# Patient Record
Sex: Male | Born: 1955 | Race: Black or African American | Hispanic: No | Smoking: Never smoker
Health system: Southern US, Community
[De-identification: ages and names within clinical notes are randomized; demographics above are authoritative.]

---

## 2004-08-10 ENCOUNTER — Emergency Department (HOSPITAL_COMMUNITY): Admission: EM | Admit: 2004-08-10 | Discharge: 2004-08-11 | Payer: Self-pay | Admitting: Emergency Medicine

## 2004-08-12 ENCOUNTER — Emergency Department (HOSPITAL_COMMUNITY): Admission: EM | Admit: 2004-08-12 | Discharge: 2004-08-12 | Payer: Self-pay | Admitting: Emergency Medicine

## 2006-12-11 IMAGING — CR DG FINGER RING 2+V*R*
3 series · 3 of 3 positions shown · non-contrast
Comparison: none

CLINICAL DATA: Injury to distal right ring finger playing baseball last week. 
 RIGHT RING FINGER ? [] VIEWS:
 Recent fracture of the distal aspect of the terminal phalangeal tuft.

[x finger pa right]
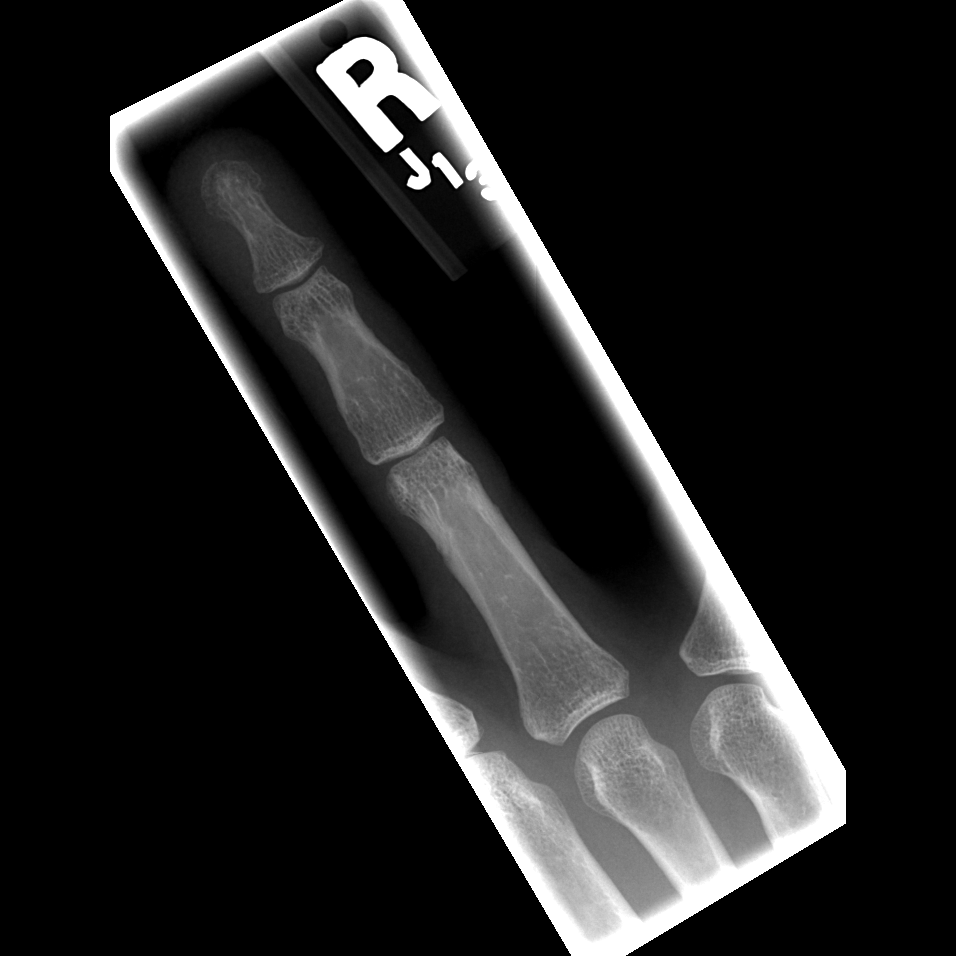

[x finger obl. right]
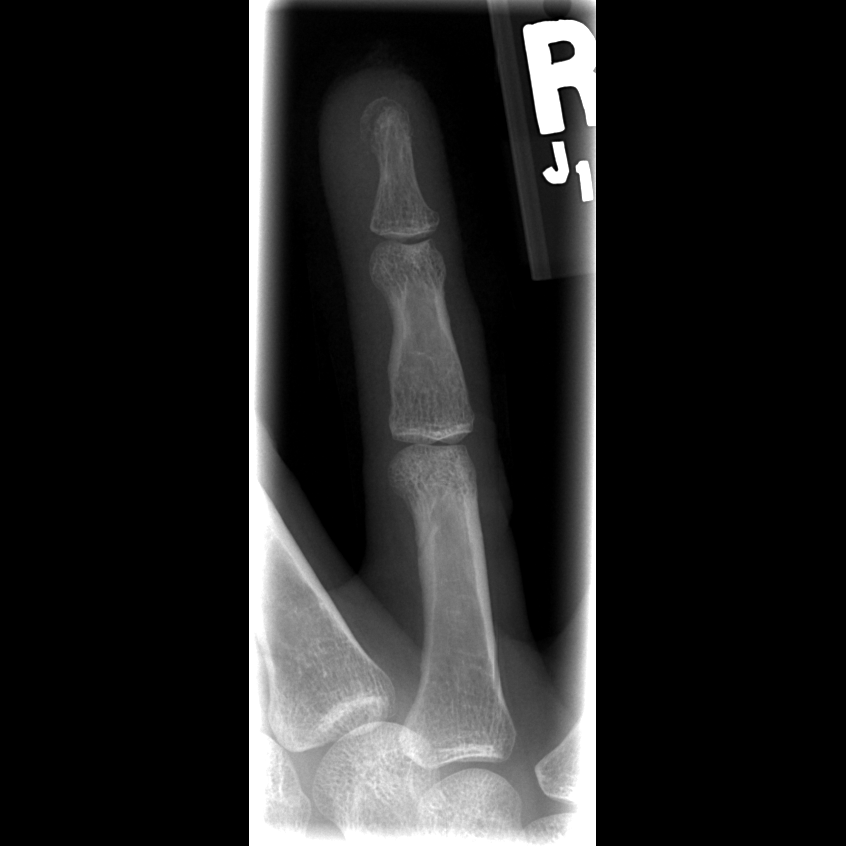

[x finger lateral right]
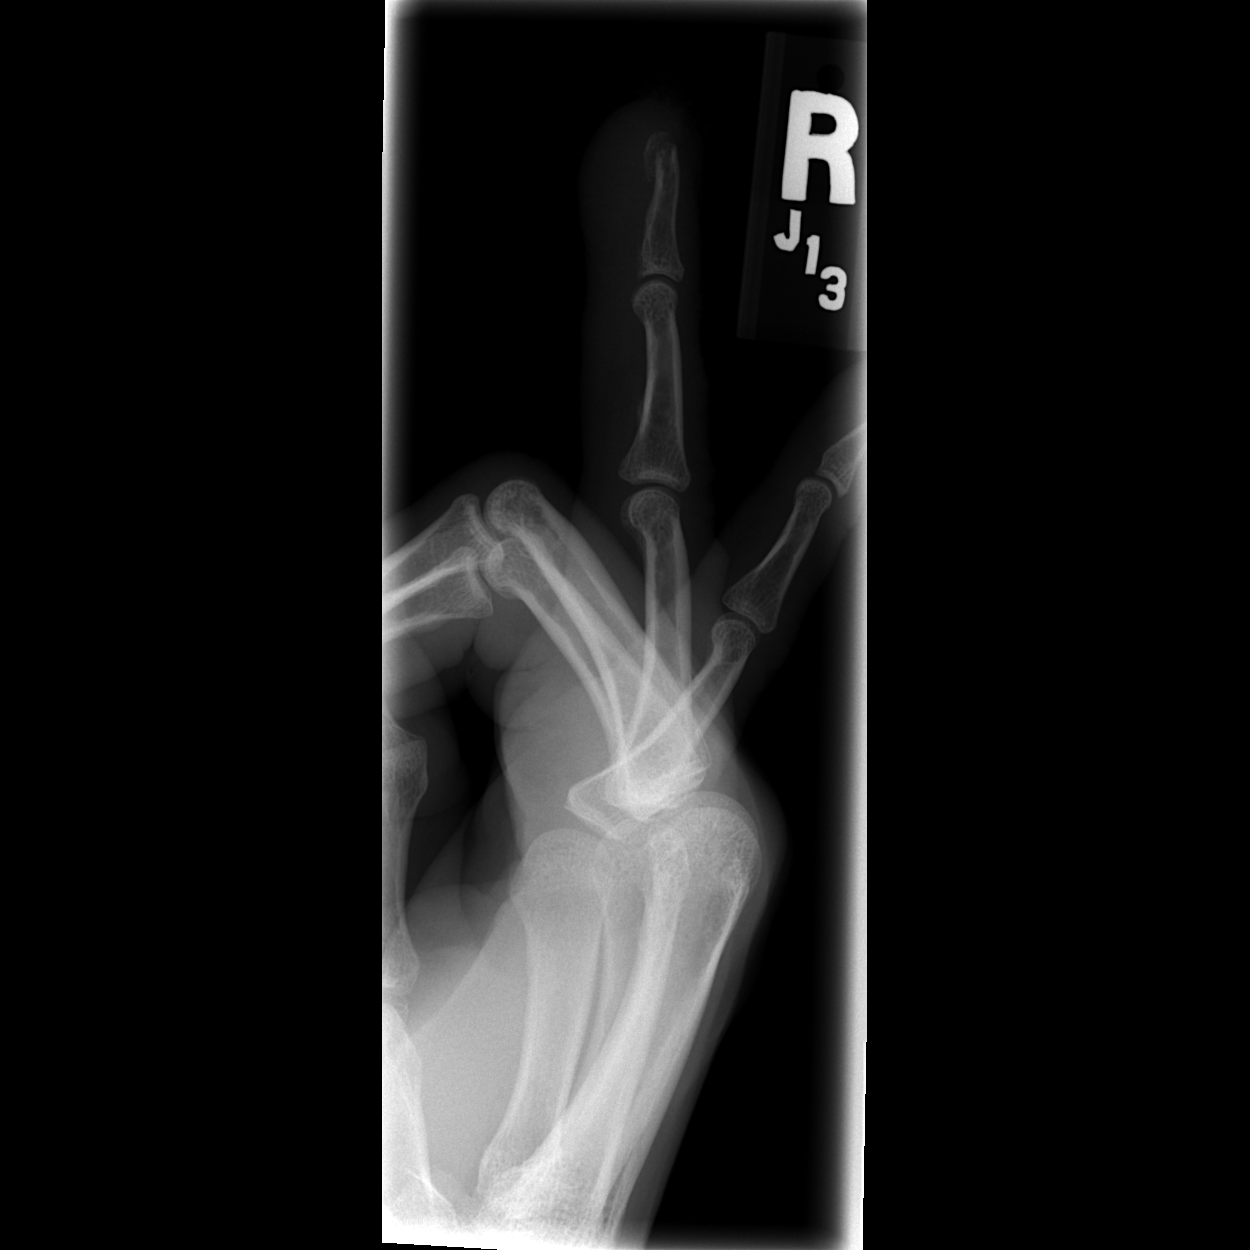

[3 of 3 positions shown; findings below may reference images not displayed]

IMPRESSION: Fracture terminal phalangeal tuft right fourth finger.

## 2015-03-10 ENCOUNTER — Ambulatory Visit (INDEPENDENT_AMBULATORY_CARE_PROVIDER_SITE_OTHER): Payer: Self-pay | Admitting: Emergency Medicine

## 2015-03-10 ENCOUNTER — Ambulatory Visit (INDEPENDENT_AMBULATORY_CARE_PROVIDER_SITE_OTHER): Payer: Self-pay

## 2015-03-10 VITALS — BP 124/80 | HR 80 | Temp 98.6°F | Resp 17 | Ht 68.5 in | Wt 189.0 lb

## 2015-03-10 DIAGNOSIS — J3089 Other allergic rhinitis: Secondary | ICD-10-CM

## 2015-03-10 DIAGNOSIS — R05 Cough: Secondary | ICD-10-CM

## 2015-03-10 DIAGNOSIS — R938 Abnormal findings on diagnostic imaging of other specified body structures: Secondary | ICD-10-CM

## 2015-03-10 DIAGNOSIS — R059 Cough, unspecified: Secondary | ICD-10-CM

## 2015-03-10 DIAGNOSIS — R9389 Abnormal findings on diagnostic imaging of other specified body structures: Secondary | ICD-10-CM

## 2015-03-10 DIAGNOSIS — R942 Abnormal results of pulmonary function studies: Secondary | ICD-10-CM

## 2015-03-10 LAB — POCT CBC
Granulocyte percent: 44.8 %G (ref 37–80)
HEMATOCRIT: 47.9 % (ref 43.5–53.7)
Hemoglobin: 16.4 g/dL (ref 14.1–18.1)
LYMPH, POC: 2.4 (ref 0.6–3.4)
MCH, POC: 29.3 pg (ref 27–31.2)
MCHC: 34.3 g/dL (ref 31.8–35.4)
MCV: 85.4 fL (ref 80–97)
MID (CBC): 0.5 (ref 0–0.9)
MPV: 7.4 fL (ref 0–99.8)
POC GRANULOCYTE: 2.3 (ref 2–6.9)
POC LYMPH %: 45.2 % (ref 10–50)
POC MID %: 10 % (ref 0–12)
Platelet Count, POC: 228 10*3/uL (ref 142–424)
RBC: 5.61 M/uL (ref 4.69–6.13)
RDW, POC: 13.5 %
WBC: 5.2 10*3/uL (ref 4.6–10.2)

## 2015-03-10 LAB — PULMONARY FUNCTION TEST

## 2015-03-10 MED ORDER — ALBUTEROL SULFATE HFA 108 (90 BASE) MCG/ACT IN AERS: 2.0000 | INHALATION_SPRAY | RESPIRATORY_TRACT | Status: DC | PRN

## 2015-03-10 MED ORDER — DOXYCYCLINE HYCLATE 100 MG PO CAPS: 100.0000 mg | ORAL_CAPSULE | Freq: Two times a day (BID) | ORAL | Status: DC

## 2015-03-10 MED ORDER — FLUTICASONE PROPIONATE 50 MCG/ACT NA SUSP: 2.0000 | Freq: Every day | NASAL | Status: DC

## 2015-03-10 MED ORDER — IPRATROPIUM BROMIDE 0.02 % IN SOLN
0.5000 mg | Freq: Once | RESPIRATORY_TRACT | Status: AC
  Administered 2015-03-10: 0.5 mg via RESPIRATORY_TRACT

## 2015-03-10 MED ORDER — ALBUTEROL SULFATE (2.5 MG/3ML) 0.083% IN NEBU
2.5000 mg | INHALATION_SOLUTION | Freq: Once | RESPIRATORY_TRACT | Status: AC
Start: 2015-03-10 — End: 2015-03-10
  Administered 2015-03-10: 2.5 mg via RESPIRATORY_TRACT

## 2015-03-10 NOTE — Progress Notes (Addendum)
By signing my name below, I, Gregory Rocha, attest that this documentation has been prepared under the direction and in the presence of Gregory Chris, MD.  Gregory Rocha, Medical Scribe. 03/10/2015.  12:56 PM.   Chief Complaint:  Chief Complaint  Patient presents with  . URI  . Shortness of Breath    HPI: Gregory Rocha is a 60 y.o. male who reports to [facility] today complaining of a possible URI, gradual onset 1 month ago. Pt reports that he has been having persistent cough with white-yellow discharge, and congestion. He indicates that at times his couging episodes are severe enough to cause mild chest wall pain. Pt took sudafed few days ago, and notes that he found relief for his congestion. He reports no history of experiencing similar symptoms recently. Pt is not a smoker, and he reports that he is not on any medications currently. He denies chest pain, wheezing, rales, heat burn, belching, recent travels, or being exposed to new pets.    No past medical history on file. No past surgical history on file. Social History   Social History  . Marital Status: Married    Spouse Name: N/A  . Number of Children: N/A  . Years of Education: N/A   Social History Main Topics  . Smoking status: Never Smoker   . Smokeless tobacco: None  . Alcohol Use: None  . Drug Use: None  . Sexual Activity: Not Asked   Other Topics Concern  . None   Social History Narrative  . None   No family history on file. No Known Allergies Prior to Admission medications   Not on File     ROS: The patient has cough, congestion.  Patient denies chest pain, wheezing, heat burn, or belching,  All other systems have been reviewed and were otherwise negative with the exception of those mentioned in the HPI and as above.    PHYSICAL EXAM: Filed Vitals:   03/10/15 1213  BP: 124/80  Pulse: 80  Temp: 98.6 F (37 C)  Resp: 17   Body mass index is 28.32 kg/(m^2).   General: Alert, no acute  distress HEENT:  Normocephalic, atraumatic, oropharynx patent. Eye: Nonie Hoyer [facility] Cardiovascular:  Regular rate and rhythm, no rubs murmurs or gallops.  No Carotid bruits, radial pulse intact. No pedal edema.  Respiratory: No rales.  No cyanosis, no use of accessory musculature. Coarse rhonchi both lungs. No wheezes. Deep sounding wet cough.  Abdominal: No organomegaly, abdomen is soft and non-tender, positive bowel sounds.  No masses. Musculoskeletal: Gait intact. No edema, tenderness Skin: No rashes. Neurologic: Facial musculature symmetric. Psychiatric: Patient acts appropriately throughout our interaction. Lymphatic: No cervical or submandibular lymphadenopathy   LABS: Results for orders placed or performed in visit on 03/10/15  POCT CBC  Result Value Ref Range   WBC 5.2 4.6 - 10.2 K/uL   Lymph, poc 2.4 0.6 - 3.4   POC LYMPH PERCENT 45.2 10 - 50 %L   MID (cbc) 0.5 0 - 0.9   POC MID % 10.0 0 - 12 %M   POC Granulocyte 2.3 2 - 6.9   Granulocyte percent 44.8 37 - 80 %G   RBC 5.61 4.69 - 6.13 M/uL   Hemoglobin 16.4 14.1 - 18.1 g/dL   HCT, POC 45.4 09.8 - 53.7 %   MCV 85.4 80 - 97 fL   MCH, POC 29.3 27 - 31.2 pg   MCHC 34.3 31.8 - 35.4 g/dL   RDW, POC 11.9 %  Platelet Count, POC 228 142 - 424 K/uL   MPV 7.4 0 - 99.8 fL     EKG/XRAY:   Primary read interpreted by Dr. Cleta Alberts at [facility].  Dg Chest 2 View  03/10/2015  CLINICAL DATA:  Shortness of breath EXAM: CHEST  2 VIEW COMPARISON:  None in PACs FINDINGS: The lungs are mildly hyperinflated with hemidiaphragm flattening. The heart and pulmonary vascularity are normal. The mediastinum is normal in width. There is no pleural effusion there is no pneumothorax. The bony thorax is unremarkable. IMPRESSION: Hyperinflation consistent with reactive airway disease or COPD. There is no evidence of pneumonia nor CHF. Electronically Signed   By: David  [place] M.D.   On: 03/10/2015 13:17    ASSESSMENT/PLAN: Chest x-ray is consistent with COPD.  Patient is not a smoker. Will treat with doxycycline along with an albuterol inhaler and Flonase. Referral made to pulmonary for their evaluation. Pulmonary function test and our office revealed an FVC of 73% FEV1 of 77%.I personally performed the services described in this documentation, which was scribed in my presence. The recorded information has been reviewed and is accurate.    Gross sideeffects, risk and benefits, and alternatives of medications d/w patient. Patient is aware that all medications have potential sideeffects and we are unable to predict every sideeffect or drug-drug interaction that may occur.  Gregory Chris MD 03/10/2015 12:48 PM

## 2015-03-10 NOTE — Patient Instructions (Addendum)
Because you received an x-ray today, you will receive an invoice from [facility]. Please contact [facility] at [phone] with questions or concerns regarding your invoice. Our billing staff will not be able to assist you with those questions. Allergic Rhinitis Allergic rhinitis is when the mucous membranes in the nose respond to allergens. Allergens are particles in the air that cause your body to have an allergic reaction. This causes you to release allergic antibodies. Through a chain of events, these eventually cause you to release histamine into the blood stream. Although meant to protect the body, it is this release of histamine that causes your discomfort, such as frequent sneezing, congestion, and an itchy, runny nose.  CAUSES Seasonal allergic rhinitis (hay fever) is caused by pollen allergens that may come from grasses, trees, and weeds. Year-round allergic rhinitis (perennial allergic rhinitis) is caused by allergens such as house dust mites, pet dander, and mold spores. SYMPTOMS  Nasal stuffiness (congestion).  Itchy, runny nose with sneezing and tearing of the eyes. DIAGNOSIS Your health care provider can help you determine the allergen or allergens that trigger your symptoms. If you and your health care provider are unable to determine the allergen, skin or blood testing may be used. Your health care provider will diagnose your condition after taking your health history and performing a physical exam. Your health care provider may assess you for other related conditions, such as asthma, pink eye, or an ear infection. TREATMENT Allergic rhinitis does not have a cure, but it can be controlled by:  Medicines that block allergy symptoms. These may include allergy shots, nasal sprays, and oral antihistamines.  Avoiding the allergen. Hay fever may often be treated with antihistamines in pill or nasal spray forms. Antihistamines block the effects of histamine. There  are over-the-counter medicines that may help with nasal congestion and swelling around the eyes. Check with your health care provider before taking or giving this medicine. If avoiding the allergen or the medicine prescribed do not work, there are many new medicines your health care provider can prescribe. Stronger medicine may be used if initial measures are ineffective. Desensitizing injections can be used if medicine and avoidance does not work. Desensitization is when a patient is given ongoing shots until the body becomes less sensitive to the allergen. Make sure you follow up with your health care provider if problems continue. HOME CARE INSTRUCTIONS It is not possible to completely avoid allergens, but you can reduce your symptoms by taking steps to limit your exposure to them. It helps to know exactly what you are allergic to so that you can avoid your specific triggers. SEEK MEDICAL CARE IF:  You have a fever.  You develop a cough that does not stop easily (persistent).  You have shortness of breath.  You start wheezing.  Symptoms interfere with normal daily activities.   This information is not intended to replace advice given to you by your health care provider. Make sure you discuss any questions you have with your health care provider.   Document Released: 10/20/2000 Document Revised: 02/15/2014 Document Reviewed: 10/02/2012 Elsevier Interactive Patient Education 2016 Elsevier Inc. Cough, Adult Coughing is a reflex that clears your throat and your airways. Coughing helps to heal and protect your lungs. It is normal to cough occasionally, but a cough that happens with other symptoms or lasts a long time may be a sign of a condition that needs treatment. A cough may last only 2-3 weeks (acute), or it may last longer  than 8 weeks (chronic). CAUSES Coughing is commonly caused by:  Breathing in substances that irritate your lungs.  A viral or bacterial respiratory  infection.  Allergies.  Asthma.  Postnasal drip.  Smoking.  Acid backing up from the stomach into the esophagus (gastroesophageal reflux).  Certain medicines.  Chronic lung problems, including COPD (or rarely, lung cancer).  Other medical conditions such as heart failure. HOME CARE INSTRUCTIONS  Pay attention to any changes in your symptoms. Take these actions to help with your discomfort:  Take medicines only as told by your health care provider.  If you were prescribed an antibiotic medicine, take it as told by your health care provider. Do not stop taking the antibiotic even if you start to feel better.  Talk with your health care provider before you take a cough suppressant medicine.  Drink enough fluid to keep your urine clear or pale yellow.  If the air is dry, use a cold steam vaporizer or humidifier in your bedroom or your home to help loosen secretions.  Avoid anything that causes you to cough at work or at home.  If your cough is worse at night, try sleeping in a semi-upright position.  Avoid cigarette smoke. If you smoke, quit smoking. If you need help quitting, ask your health care provider.  Avoid caffeine.  Avoid alcohol.  Rest as needed. SEEK MEDICAL CARE IF:   You have new symptoms.  You cough up pus.  Your cough does not get better after 2-3 weeks, or your cough gets worse.  You cannot control your cough with suppressant medicines and you are losing sleep.  You develop pain that is getting worse or pain that is not controlled with pain medicines.  You have a fever.  You have unexplained weight loss.  You have night sweats. SEEK IMMEDIATE MEDICAL CARE IF:  You cough up blood.  You have difficulty breathing.  Your heartbeat is very fast.   This information is not intended to replace advice given to you by your health care provider. Make sure you discuss any questions you have with your health care provider.   Document Released:  07/24/2010 Document Revised: 10/16/2014 Document Reviewed: 04/03/2014 Elsevier Interactive Patient Education [employer]

## 2015-03-21 ENCOUNTER — Encounter: Payer: Self-pay | Admitting: Emergency Medicine

## 2015-03-26 ENCOUNTER — Other Ambulatory Visit: Payer: Self-pay | Admitting: Emergency Medicine

## 2015-03-27 ENCOUNTER — Other Ambulatory Visit: Payer: Self-pay | Admitting: Emergency Medicine

## 2015-04-01 ENCOUNTER — Encounter: Payer: Self-pay | Admitting: Internal Medicine

## 2015-04-01 ENCOUNTER — Other Ambulatory Visit (INDEPENDENT_AMBULATORY_CARE_PROVIDER_SITE_OTHER): Payer: Self-pay

## 2015-04-01 ENCOUNTER — Ambulatory Visit (INDEPENDENT_AMBULATORY_CARE_PROVIDER_SITE_OTHER): Payer: Self-pay | Admitting: Internal Medicine

## 2015-04-01 VITALS — BP 120/72 | HR 83 | Ht 69.0 in | Wt 185.8 lb

## 2015-04-01 DIAGNOSIS — R05 Cough: Secondary | ICD-10-CM

## 2015-04-01 DIAGNOSIS — R058 Other specified cough: Secondary | ICD-10-CM

## 2015-04-01 LAB — CBC WITH DIFFERENTIAL/PLATELET
BASOS PCT: 1.3 % (ref 0.0–3.0)
Basophils Absolute: 0.1 10*3/uL (ref 0.0–0.1)
EOS ABS: 0.1 10*3/uL (ref 0.0–0.7)
EOS PCT: 1.5 % (ref 0.0–5.0)
HEMATOCRIT: 44 % (ref 39.0–52.0)
HEMOGLOBIN: 15 g/dL (ref 13.0–17.0)
LYMPHS PCT: 43.7 % (ref 12.0–46.0)
Lymphs Abs: 2 10*3/uL (ref 0.7–4.0)
MCHC: 34.1 g/dL (ref 30.0–36.0)
MCV: 84.2 fl (ref 78.0–100.0)
MONOS PCT: 9.7 % (ref 3.0–12.0)
Monocytes Absolute: 0.4 10*3/uL (ref 0.1–1.0)
Neutro Abs: 2 10*3/uL (ref 1.4–7.7)
Neutrophils Relative %: 43.8 % (ref 43.0–77.0)
Platelets: 227 10*3/uL (ref 150.0–400.0)
RBC: 5.22 Mil/uL (ref 4.22–5.81)
RDW: 13.8 % (ref 11.5–15.5)
WBC: 4.5 10*3/uL (ref 4.0–10.5)

## 2015-04-01 NOTE — Patient Instructions (Signed)
Try prilosec otc   Take 30-60 min before first meal of the day and Pepcid ac (famotidine) 20 mg one @  bedtime until cough is completely gone for at least a week without the need for cough suppression   GERD (REFLUX)  is an extremely common cause of respiratory symptoms just like yours , many times with no obvious heartburn at all.    It can be treated with medication, but also with lifestyle changes including elevation of the head of your bed (ideally with 6 inch  bed blocks),  Smoking cessation, avoidance of late meals, excessive alcohol, and avoid fatty foods, chocolate, peppermint, colas, red wine, and acidic juices such as orange juice.  NO MINT OR MENTHOL PRODUCTS SO NO COUGH DROPS  USE SUGARLESS CANDY INSTEAD (Jolley ranchers or Stover's or Life Savers) or even ice chips will also do - the key is to swallow to prevent all throat clearing. NO OIL BASED VITAMINS - use powdered substitutes.  Please remember to go to the lab  department downstairs for your tests - we will call you with the results when they are available.

## 2015-04-01 NOTE — Assessment & Plan Note (Addendum)
-   Spirometry 03/10/15 wnl - Allergy profile 04/01/2015 >  Eos 0.1/  IgE  Pending    Classic Upper airway cough syndrome, so named because it's frequently impossible to sort out how much is  CR/sinusitis with freq throat clearing (which can be related to primary GERD)   vs  causing  secondary (" extra esophageal")  GERD from wide swings in gastric pressure that occur with throat clearing, often  promoting self use of mint and menthol lozenges that reduce the lower esophageal sphincter tone and exacerbate the problem further in a cyclical fashion.   These are the same pts (now being labeled as having "irritable larynx syndrome" by some cough centers) who not infrequently have a history of having failed to tolerate ace inhibitors,  dry powder inhalers or biphosphonates or report having atypical reflux symptoms that don't respond to standard doses of PPI , and are easily confused as having aecopd or asthma flares by even experienced allergists/ pulmonologists.  Explained the natural history of uri and why it's necessary in patients at risk to treat GERD aggressively - at least  short term -   to reduce risk of evolving cyclical cough initially  triggered by epithelial injury and a heightened sensitivty to the effects of any upper airway irritants,  most importantly acid - related - then perpetuated by epithelial injury related to the cough itself as the upper airway collapses on itself.  That is, the more sensitive the epithelium becomes once it is damaged by the virus, the more the ensuing irritability> the more the cough, the more the secondary reflux (especially in those prone to reflux) the more the irritation of the sensitive mucosa and so on in a  Classic cyclical pattern.    For now just rec otcs prn cough and complete the w/u with allergy profile/ consider sinus ct if persists   Total time devoted to counseling  = 25/69m review case with pt/ discussion of options/alternatives/ personally creating in  presence of pt  then going over specific  Instructions directly with the pt including how to use all of the meds but in particular covering each new medication in detail (see avs)
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
## 2015-04-02 LAB — RESPIRATORY ALLERGY PROFILE REGION II ~~LOC~~
Allergen, Cottonwood, t14: <0.1 kU/L
Allergen, D pternoyssinus,d7: <0.1 kU/L
Allergen, Oak,t7: <0.1 kU/L
Box Elder IgE: <0.1 kU/L
Cat Dander: <0.1 kU/L
D. farinae: <0.1 kU/L
Dog Dander: <0.1 kU/L
Elm IgE: <0.1 kU/L
IGE (IMMUNOGLOBULIN E), SERUM: 3 kU/L (ref <115)
Johnson Grass: <0.1 kU/L
Sheep Sorrel IgE: <0.1 kU/L

## 2015-04-04 NOTE — Progress Notes (Signed)
Quick Note:  LMTCB ______ 

## 2015-04-07 ENCOUNTER — Telehealth: Payer: Self-pay | Admitting: Internal Medicine

## 2015-04-07 NOTE — Progress Notes (Signed)
Quick Note:  Spoke with pt and notified of results per Dr. Wert. Pt verbalized understanding and denied any questions.  ______ 

## 2015-04-07 NOTE — Telephone Encounter (Signed)
Call patient : Studies are unremarkable, no change in recs  Spoke with pt and notified of results per Dr. Wert. Pt verbalized understanding and denied any questions.  

## 2017-07-08 IMAGING — CR DG CHEST 2V
2 series · 2 of 2 positions shown · non-contrast
Comparison: None in PACs

CLINICAL DATA: Shortness of breath

EXAM:
CHEST  2 VIEW

[PA]
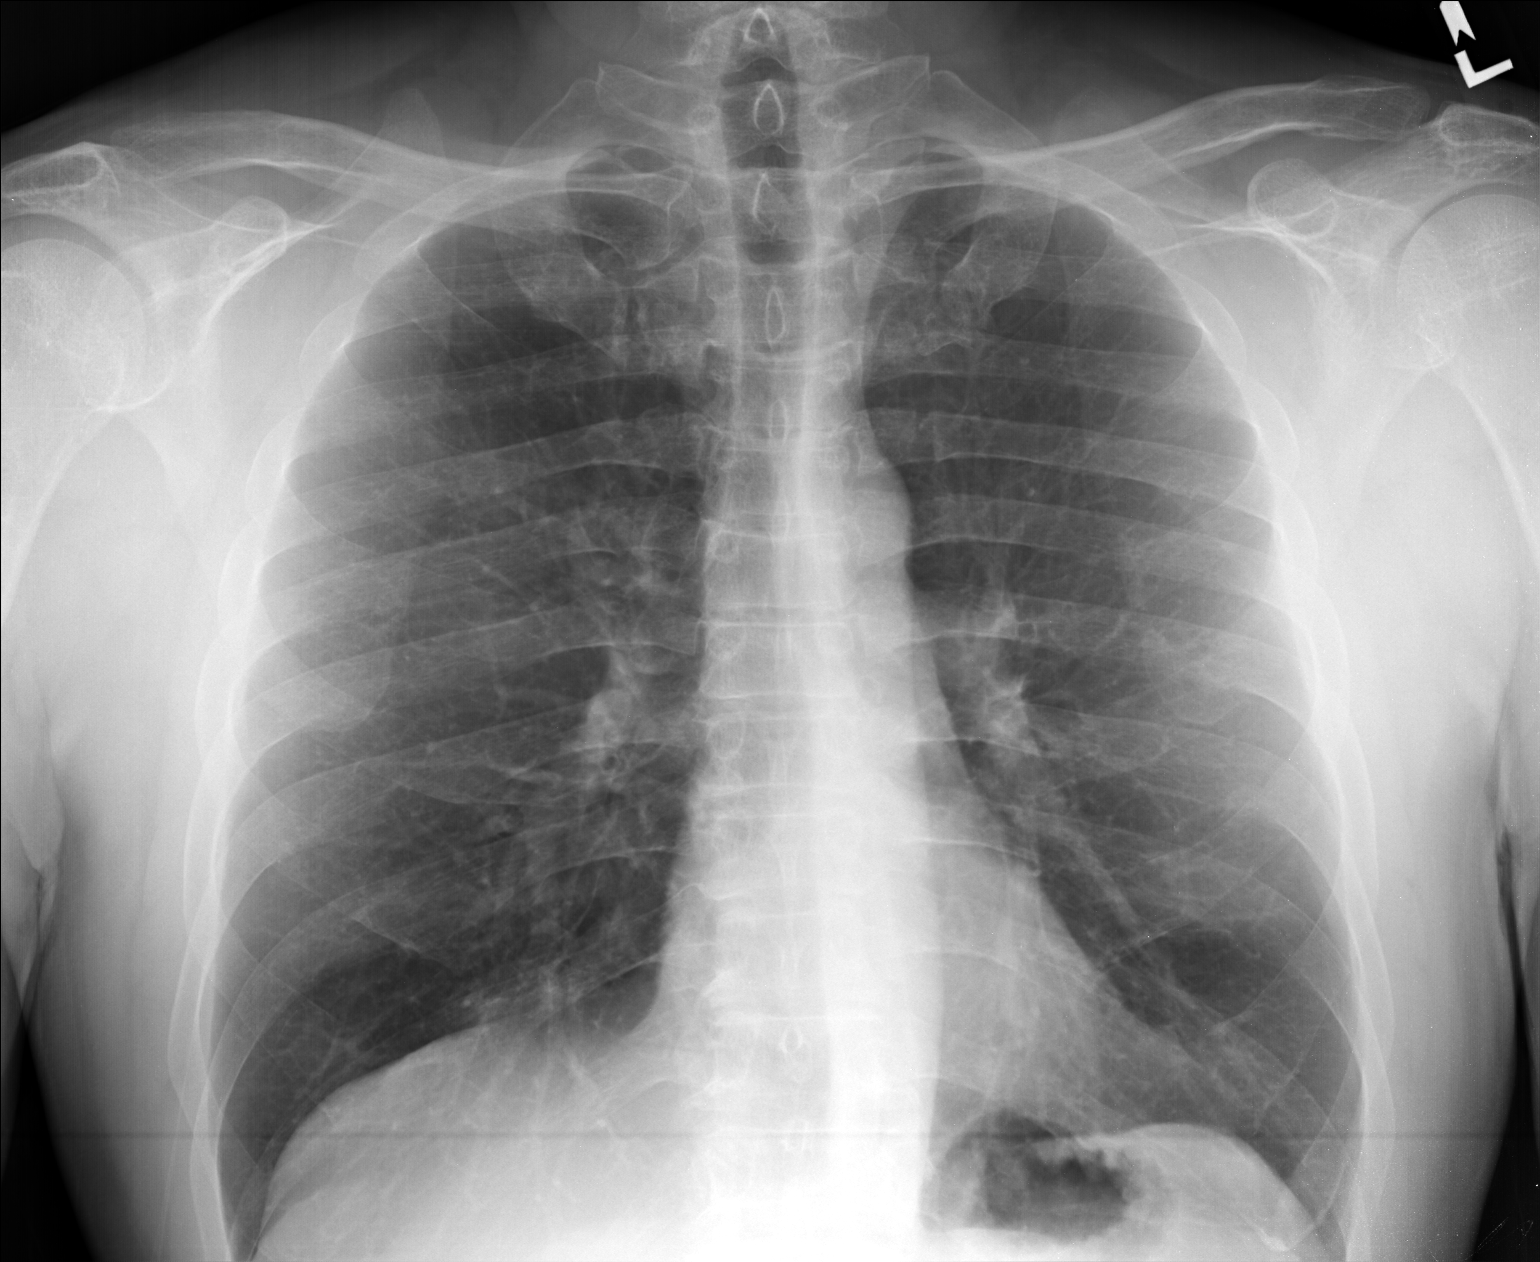

[lateral]
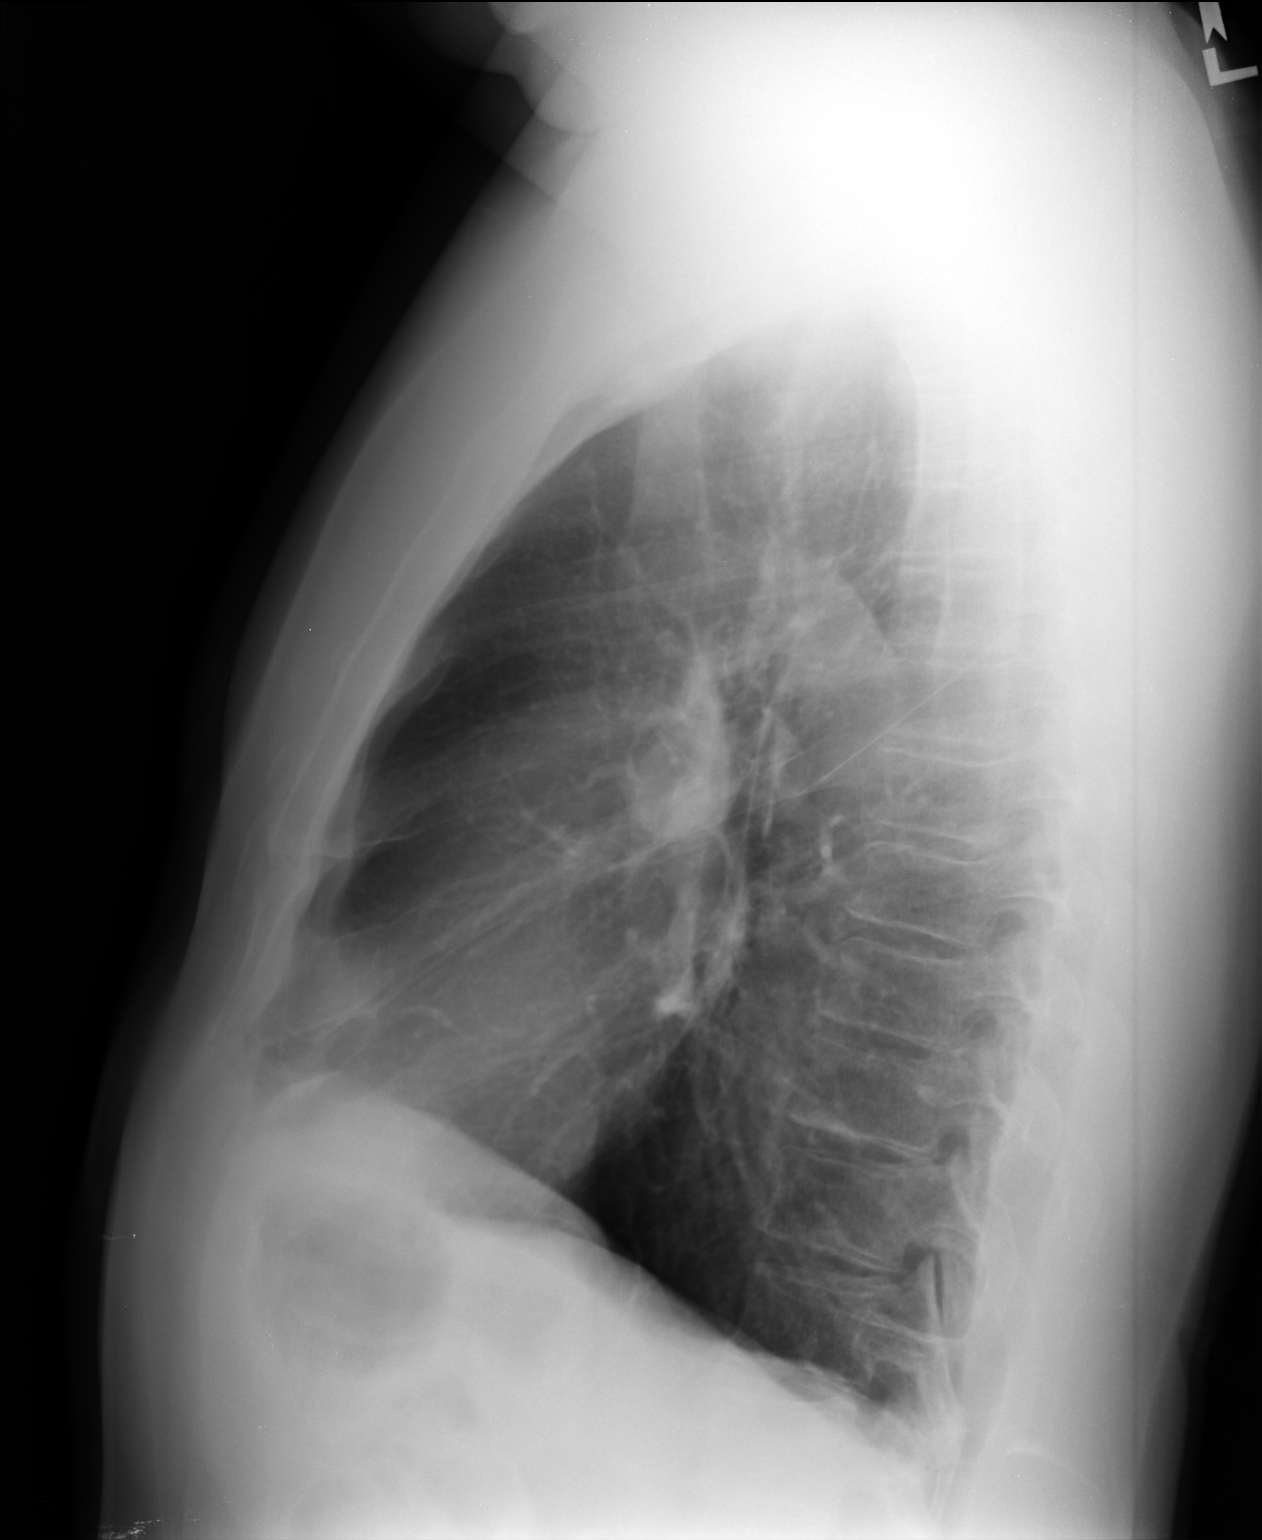

[2 of 2 positions shown; findings below may reference images not displayed]

FINDINGS: The lungs are mildly hyperinflated with hemidiaphragm flattening.
The heart and pulmonary vascularity are normal. The mediastinum is
normal in width. There is no pleural effusion there is no
pneumothorax. The bony thorax is unremarkable.
IMPRESSION: Hyperinflation consistent with reactive airway disease or COPD.
There is no evidence of pneumonia nor CHF.

## 2020-02-14 ENCOUNTER — Ambulatory Visit: Payer: Self-pay | Attending: Internal Medicine

## 2020-02-14 DIAGNOSIS — Z23 Encounter for immunization: Secondary | ICD-10-CM

## 2020-02-14 NOTE — Progress Notes (Signed)
   Covid-19 Vaccination Clinic  Name:  Delta Deshmukh    MRN: [id number] DOB: 1955-12-11  02/14/2020  Mr. Matlock was observed post Covid-19 immunization for 15 minutes without incident. He was provided with Vaccine Information Sheet and instruction to access the V-Safe system.   Mr. Getter was instructed to call 911 with any severe reactions post vaccine: Marland Kitchen Difficulty breathing  . Swelling of face and throat  . A fast heartbeat  . A bad rash all over body  . Dizziness and weakness   Immunizations Administered    Name Date Dose VIS Date Route   Pfizer COVID-19 Vaccine 02/14/2020  2:35 PM 0.3 mL 11/28/2019 Intramuscular   Manufacturer: [employer], [employer]   Lot: [id number]   NDC: [address]-5

## 2020-08-13 DIAGNOSIS — H524 Presbyopia: Secondary | ICD-10-CM | POA: Diagnosis not present

## 2020-10-02 LAB — HM HEPATITIS C SCREENING LAB: HM Hepatitis Screen: NEGATIVE

## 2020-10-06 ENCOUNTER — Telehealth: Payer: Self-pay | Admitting: Internal Medicine

## 2020-10-06 NOTE — Telephone Encounter (Signed)
Left message asking pt to call   Update records  Our records show that Gregory Rocha is patient pcp.  Epic has no one has pcp  Confirm  pt has another pcp or did he want to schedule npp appt with tima

## 2020-10-06 NOTE — Telephone Encounter (Signed)
I was updating records and My spreadsheet had Dr Allyne Gee as patient provider.  I spoke with patient he stated he was a patient of Dr Allyne Gee 10+ years ago and wants to re-establish with her.  Patient has [facility] medicare.    Patient is aware someone from the office will call to schedule appointment.  Can you help me with this?  Thank you Zella Ball

## 2020-10-08 ENCOUNTER — Encounter: Payer: Self-pay | Admitting: Internal Medicine

## 2021-10-07 ENCOUNTER — Ambulatory Visit: Payer: Self-pay | Admitting: Nurse Practitioner

## 2021-10-20 ENCOUNTER — Ambulatory Visit (INDEPENDENT_AMBULATORY_CARE_PROVIDER_SITE_OTHER): Payer: BC Managed Care – PPO | Admitting: Nurse Practitioner

## 2021-10-20 ENCOUNTER — Encounter: Payer: Self-pay | Admitting: Nurse Practitioner

## 2021-10-20 VITALS — BP 112/68 | HR 100 | Temp 98.4°F | Ht 70.0 in | Wt 188.4 lb

## 2021-10-20 DIAGNOSIS — Z2821 Immunization not carried out because of patient refusal: Secondary | ICD-10-CM | POA: Diagnosis not present

## 2021-10-20 DIAGNOSIS — Z125 Encounter for screening for malignant neoplasm of prostate: Secondary | ICD-10-CM

## 2021-10-20 DIAGNOSIS — Z1211 Encounter for screening for malignant neoplasm of colon: Secondary | ICD-10-CM

## 2021-10-20 DIAGNOSIS — R351 Nocturia: Secondary | ICD-10-CM | POA: Diagnosis not present

## 2021-10-20 NOTE — Patient Instructions (Signed)
Health Maintenance, Male Adopting a healthy lifestyle and getting preventive care are important in promoting health and wellness. Ask your health care provider about: The right schedule for you to have regular tests and exams. Things you can do on your own to prevent diseases and keep yourself healthy. What should I know about diet, weight, and exercise? Eat a healthy diet  Eat a diet that includes plenty of vegetables, fruits, low-fat dairy products, and lean protein. Do not eat a lot of foods that are high in solid fats, added sugars, or sodium. Maintain a healthy weight Body mass index (BMI) is a measurement that can be used to identify possible weight problems. It estimates body fat based on height and weight. Your health care provider can help determine your BMI and help you achieve or maintain a healthy weight. Get regular exercise Get regular exercise. This is one of the most important things you can do for your health. Most adults should: Exercise for at least 150 minutes each week. The exercise should increase your heart rate and make you sweat (moderate-intensity exercise). Do strengthening exercises at least twice a week. This is in addition to the moderate-intensity exercise. Spend less time sitting. Even light physical activity can be beneficial. Watch cholesterol and blood lipids Have your blood tested for lipids and cholesterol at 67 years of age, then have this test every 5 years. You may need to have your cholesterol levels checked more often if: Your lipid or cholesterol levels are high. You are older than 67 years of age. You are at high risk for heart disease. What should I know about cancer screening? Many types of cancers can be detected early and may often be prevented. Depending on your health history and family history, you may need to have cancer screening at various ages. This may include screening for: Colorectal cancer. Prostate cancer. Skin cancer. Lung  cancer. What should I know about heart disease, diabetes, and high blood pressure? Blood pressure and heart disease High blood pressure causes heart disease and increases the risk of stroke. This is more likely to develop in people who have high blood pressure readings or are overweight. Talk with your health care provider about your target blood pressure readings. Have your blood pressure checked: Every 3-5 years if you are 18-39 years of age. Every year if you are 40 years old or older. If you are between the ages of 65 and 75 and are a current or former smoker, ask your health care provider if you should have a one-time screening for abdominal aortic aneurysm (AAA). Diabetes Have regular diabetes screenings. This checks your fasting blood sugar level. Have the screening done: Once every three years after age 45 if you are at a normal weight and have a low risk for diabetes. More often and at a younger age if you are overweight or have a high risk for diabetes. What should I know about preventing infection? Hepatitis B If you have a higher risk for hepatitis B, you should be screened for this virus. Talk with your health care provider to find out if you are at risk for hepatitis B infection. Hepatitis C Blood testing is recommended for: Everyone born from 1945 through 1965. Anyone with known risk factors for hepatitis C. Sexually transmitted infections (STIs) You should be screened each year for STIs, including gonorrhea and chlamydia, if: You are sexually active and are younger than 67 years of age. You are older than 67 years of age and your   health care provider tells you that you are at risk for this type of infection. Your sexual activity has changed since you were last screened, and you are at increased risk for chlamydia or gonorrhea. Ask your health care provider if you are at risk. Ask your health care provider about whether you are at high risk for HIV. Your health care provider  may recommend a prescription medicine to help prevent HIV infection. If you choose to take medicine to prevent HIV, you should first get tested for HIV. You should then be tested every 3 months for as long as you are taking the medicine. Follow these instructions at home: Alcohol use Do not drink alcohol if your health care provider tells you not to drink. If you drink alcohol: Limit how much you have to 0-2 drinks a day. Know how much alcohol is in your drink. In the [place] one drink equals one 12 oz bottle of beer (355 mL), one 5 oz glass of wine (148 mL), or one 1 oz glass of hard liquor (44 mL). Lifestyle Do not use any products that contain nicotine or tobacco. These products include cigarettes, chewing tobacco, and vaping devices, such as e-cigarettes. If you need help quitting, ask your health care provider. Do not use street drugs. Do not share needles. Ask your health care provider for help if you need support or information about quitting drugs. General instructions Schedule regular health, dental, and eye exams. Stay current with your vaccines. Tell your health care provider if: You often feel depressed. You have ever been abused or do not feel safe at home. Summary Adopting a healthy lifestyle and getting preventive care are important in promoting health and wellness. Follow your health care provider's instructions about healthy diet, exercising, and getting tested or screened for diseases. Follow your health care provider's instructions on monitoring your cholesterol and blood pressure. This information is not intended to replace advice given to you by your health care provider. Make sure you discuss any questions you have with your health care provider. Document Revised: 06/16/2020 Document Reviewed: 06/16/2020 Elsevier Patient Education  2023 Elsevier Inc.  

## 2021-10-20 NOTE — Progress Notes (Signed)
Hershal Coria Martin,acting as a Neurosurgeon for Arnette Felts, FNP.,have documented all relevant documentation on the behalf of Arnette Felts, FNP,as directed by  Arnette Felts, FNP while in the presence of Arnette Felts, FNP.    Subjective:     Patient ID: Gregory Rocha , male    DOB: 07-01-1955 , 66 y.o.   MRN: [id number]   Chief Complaint  Patient presents with   Establish Care         HPI  Patient presents today to establish care. He had been seeing Dr. Allyne Gee previously but unsure of how long. Patient states he hasn't been to the doctors since 201, patient states he thinks his prostate is enlarged due to having to go to the bathroom at night. Divorced. Dgt (13 y/o ) and 3 sons (53 y/o, 29 y/o and 6 y/o) all are healthy. He works as a Clinical research associate Soil scientist).    Father - had some type of cancer - prostate or pancreatic.  Sister - breast cancer survivor. Brother - Healthy      History reviewed. No pertinent past medical history.   Family History  Problem Relation Age of Onset   Stroke Mother    Diabetes Father    Heart disease Father    Cancer Sister     No current outpatient medications on file.   No Known Allergies   Review of Systems  Constitutional: Negative.   HENT: Negative.    Eyes: Negative.   Respiratory: Negative.    Cardiovascular: Negative.   Gastrointestinal: Negative.   Psychiatric/Behavioral: Negative.       Today's Vitals   10/20/21 1541  BP: 112/68  Pulse: 100  Temp: 98.4 F (36.9 C)  TempSrc: Oral  Weight: 188 lb 6.4 oz (85.5 kg)  Height: 5\' 10"  (1.778 m)  PainSc: 0-No pain   Body mass index is 27.03 kg/m.   Objective:  Physical Exam Vitals reviewed.  Constitutional:      General: He is not in acute distress.    Appearance: Normal appearance.     Comments: overweight  HENT:     Head: Normocephalic.  Cardiovascular:     Rate and Rhythm: Normal rate and regular rhythm.     Pulses: Normal pulses.     Heart sounds: Normal heart sounds. No  murmur heard. Pulmonary:     Effort: Pulmonary effort is normal. No respiratory distress.     Breath sounds: Normal breath sounds. No wheezing.  Musculoskeletal:        General: Normal range of motion.  Skin:    General: Skin is warm and dry.     Capillary Refill: Capillary refill takes less than 2 seconds.  Neurological:     General: No focal deficit present.     Mental Status: He is alert and oriented to person, place, and time.  Psychiatric:        Mood and Affect: Mood normal.        Behavior: Behavior normal.        Thought Content: Thought content normal.        Judgment: Judgment normal.         Assessment And Plan:     1. Nocturia Comments: Will check PSA and refer to Urology - Ambulatory referral to Urology  2. Encounter for prostate cancer screening - PSA  3. Encounter for screening colonoscopy According to USPTF Colorectal cancer Screening guidelines. Colonoscopy is recommended every 10 years, starting at age 22 years. Will refer to GI for colon  cancer screening. - Ambulatory referral to Gastroenterology  4. Influenza vaccination declined Patient declined influenza vaccination at this time. Patient is aware that influenza vaccine prevents illness in 70% of healthy people, and reduces hospitalizations to 30-70% in elderly. This vaccine is recommended annually. Education has been provided regarding the importance of this vaccine but patient still declined. Advised may receive this vaccine at local pharmacy or Health Dept.or vaccine clinic. Aware to provide a copy of the vaccination record if obtained from local pharmacy or Health Dept.  Pt is willing to accept risk associated with refusing vaccination.  5. Pneumococcal vaccination declined     Patient was given opportunity to ask questions. Patient verbalized understanding of the plan and was able to repeat key elements of the plan. All questions were answered to their satisfaction.  Arnette Felts, FNP   I, Arnette Felts, FNP, have reviewed all documentation for this visit. The documentation on 10/20/21 for the exam, diagnosis, procedures, and orders are all accurate and complete.   IF YOU HAVE BEEN REFERRED TO A SPECIALIST, IT MAY TAKE 1-2 WEEKS TO SCHEDULE/PROCESS THE REFERRAL. IF YOU HAVE NOT HEARD FROM US/SPECIALIST IN TWO WEEKS, PLEASE GIVE [place] A CALL AT [phone] X [phone].   THE PATIENT IS ENCOURAGED TO PRACTICE SOCIAL DISTANCING DUE TO THE COVID-19 PANDEMIC.

## 2021-10-21 LAB — PSA: Prostate Specific Ag, Serum: 5.4 ng/mL — ABNORMAL HIGH (ref 0.0–4.0)

## 2021-11-23 DIAGNOSIS — Z1211 Encounter for screening for malignant neoplasm of colon: Secondary | ICD-10-CM | POA: Diagnosis not present

## 2022-01-07 ENCOUNTER — Ambulatory Visit (INDEPENDENT_AMBULATORY_CARE_PROVIDER_SITE_OTHER): Payer: BC Managed Care – PPO

## 2022-01-07 VITALS — Ht 70.0 in | Wt 180.0 lb

## 2022-01-07 DIAGNOSIS — Z0000 Encounter for general adult medical examination without abnormal findings: Secondary | ICD-10-CM

## 2022-01-07 NOTE — Progress Notes (Signed)
I connected with Gregory Rocha today by telephone and verified that I am speaking with the correct person using two identifiers. Location patient: home Location provider: work Persons participating in the virtual visit: Keenan Bachelorheophilus Flott, Florena Kozma LPN.   I discussed the limitations, risks, security and privacy concerns of performing an evaluation and management service by telephone and the availability of in person appointments. I also discussed with the patient that there may be a patient responsible charge related to this service. The patient expressed understanding and verbally consented to this telephonic visit.    Interactive audio and video telecommunications were attempted between this provider and patient, however failed, due to patient having technical difficulties OR patient did not have access to video capability.  We continued and completed visit with audio only.     Vital signs may be patient reported or missing.  Subjective:   Gregory Rocha is a 66 y.o. male who presents for an Initial Medicare Annual Wellness Visit.  Review of Systems     Cardiac Risk Factors include: advanced age (>8155men, 76>65 women)     Objective:    Today's Vitals   01/07/22 1216  Weight: 180 lb (81.6 kg)  Height: 5\' 10"  (1.778 m)   Body mass index is 25.83 kg/m.     01/07/2022   12:19 PM  Advanced Directives  Does Patient Have a Medical Advance Directive? No    Current Medications (verified) No outpatient encounter medications on file as of 01/07/2022.   No facility-administered encounter medications on file as of 01/07/2022.    Allergies (verified) Patient has no known allergies.   History: History reviewed. No pertinent past medical history. History reviewed. No pertinent surgical history. Family History  Problem Relation Age of Onset   Stroke Mother    Diabetes Father    Heart disease Father    Cancer Sister    Social History   Socioeconomic History    Marital status: Divorced    Spouse name: Not on file   Number of children: 4   Years of education: Not on file   Highest education level: Doctorate  Occupational History   Occupation: IT sales professionalJudge    Employer: [employer]  Tobacco Use   Smoking status: Never   Smokeless tobacco: Not on file  Vaping Use   Vaping Use: Never used  Substance and Sexual Activity   Alcohol use: Never   Drug use: Never   Sexual activity: Not Currently  Other Topics Concern   Not on file  Social History Narrative   Not on file   Social Determinants of Health   Financial Resource Strain: Low Risk  (01/07/2022)   Overall Financial Resource Strain (CARDIA)    Difficulty of Paying Living Expenses: Not hard at all  Food Insecurity: No Food Insecurity (01/07/2022)   Hunger Vital Sign    Worried About Running Out of Food in the Last Year: Never true    Ran Out of Food in the Last Year: Never true  Transportation Needs: No Transportation Needs (01/07/2022)   PRAPARE - Administrator, Civil ServiceTransportation    Lack of Transportation (Medical): No    Lack of Transportation (Non-Medical): No  Physical Activity: Sufficiently Active (01/07/2022)   Exercise Vital Sign    Days of Exercise per Week: 5 days    Minutes of Exercise per Session: 30 min  Stress: No Stress Concern Present (01/07/2022)   [employer] - Occupational Stress Questionnaire    Feeling of Stress : Not at all  Social Connections: Not on file    Tobacco Counseling Counseling given: Not Answered   Clinical Intake:  Pre-visit preparation completed: Yes  Pain : No/denies pain     Nutritional Status: BMI 25 -29 Overweight Nutritional Risks: None Diabetes: No  How often do you need to have someone help you when you read instructions, pamphlets, or other written materials from your doctor or pharmacy?: 1 - Never  Diabetic? no  Interpreter Needed?: No  Information entered by :: NAllen LPN   Activities of Daily Living     01/07/2022   12:20 PM 01/05/2022   10:54 PM  In your present state of health, do you have any difficulty performing the following activities:  Hearing? 0 0  Vision? 0 0  Difficulty concentrating or making decisions? 0 0  Walking or climbing stairs? 0 0  Dressing or bathing? 0 0  Doing errands, shopping? 0 0  Preparing Food and eating ? N N  Using the Toilet? N N  In the past six months, have you accidently leaked urine? N N  Do you have problems with loss of bowel control? N N  Managing your Medications? N N  Managing your Finances? N N  Housekeeping or managing your Housekeeping? N N    Patient Care Team: Arnette Felts, FNP as PCP - General (General Practice)  Indicate any recent Medical Services you may have received from other than [facility] providers in the past year (date may be approximate).     Assessment:   This is a routine wellness examination for Gregory Rocha.  Hearing/Vision screen Vision Screening - Comments:: No regular eye exams, [facility]  Dietary issues and exercise activities discussed: Current Exercise Habits: Home exercise routine, Type of exercise: walking, Time (Minutes): 30, Frequency (Times/Week): 5, Weekly Exercise (Minutes/Week): 150   Goals Addressed             This Visit's Progress    Patient Stated       01/07/2022, would like to see an urologist at some point       Depression Screen    01/07/2022   12:20 PM 10/20/2021    3:38 PM 03/10/2015   12:15 PM  PHQ 2/9 Scores  PHQ - 2 Score 0 0 0    Fall Risk    01/07/2022   12:20 PM 01/05/2022   10:54 PM 10/20/2021    3:38 PM  Fall Risk   Falls in the past year? 0 0 0  Number falls in past yr: 0  0  Injury with Fall? 0  0  Risk for fall due to : No Fall Risks  No Fall Risks  Follow up Falls prevention discussed;Education provided;Falls evaluation completed  Falls evaluation completed    FALL RISK PREVENTION PERTAINING TO THE HOME:  Any stairs in or around the home? Yes  If so,  are there any without handrails? No  Home free of loose throw rugs in walkways, pet beds, electrical cords, etc? Yes  Adequate lighting in your home to reduce risk of falls? Yes   ASSISTIVE DEVICES UTILIZED TO PREVENT FALLS:  Life alert? No  Use of a cane, walker or w/c? No  Grab bars in the bathroom? No  Shower chair or bench in shower? No  Elevated toilet seat or a handicapped toilet? No   TIMED UP AND GO:  Was the test performed? No .      Cognitive Function:        01/07/2022   12:21  PM  6CIT Screen  What Year? 0 points  What month? 0 points  What time? 0 points  Count back from 20 0 points  Months in reverse 0 points  Repeat phrase 0 points  Total Score 0 points    Immunizations Immunization History  Administered Date(s) Administered   PFIZER(Purple Top)SARS-COV-2 Vaccination 06/26/2019, 07/18/2019, 02/14/2020   Pfizer Covid-19 Vaccine Bivalent Booster 44yrs & up 02/14/2020    TDAP status: Due, Education has been provided regarding the importance of this vaccine. Advised may receive this vaccine at local pharmacy or Health Dept. Aware to provide a copy of the vaccination record if obtained from local pharmacy or Health Dept. Verbalized acceptance and understanding.  Flu Vaccine status: Declined, Education has been provided regarding the importance of this vaccine but patient still declined. Advised may receive this vaccine at local pharmacy or Health Dept. Aware to provide a copy of the vaccination record if obtained from local pharmacy or Health Dept. Verbalized acceptance and understanding.  Pneumococcal vaccine status: Declined,  Education has been provided regarding the importance of this vaccine but patient still declined. Advised may receive this vaccine at local pharmacy or Health Dept. Aware to provide a copy of the vaccination record if obtained from local pharmacy or Health Dept. Verbalized acceptance and understanding.   Covid-19 vaccine status:  Completed vaccines  Qualifies for Shingles Vaccine? Yes   Zostavax completed No   Shingrix Completed?: No.    Education has been provided regarding the importance of this vaccine. Patient has been advised to call insurance company to determine out of pocket expense if they have not yet received this vaccine. Advised may also receive vaccine at local pharmacy or Health Dept. Verbalized acceptance and understanding.  Screening Tests Health Maintenance  Topic Date Due   Medicare Annual Wellness (AWV)  Never done   DTaP/Tdap/Td (1 - Tdap) Never done   COLONOSCOPY (Pts 45-96yrs Insurance coverage will need to be confirmed)  Never done   COVID-19 Vaccine (5 - 2023-24 season) 10/09/2021   Zoster Vaccines- Shingrix (1 of 2) 01/19/2022 (Originally 01/23/2005)   INFLUENZA VACCINE  05/09/2022 (Originally 09/08/2021)   Pneumonia Vaccine 27+ Years old (1 - PCV) 10/21/2022 (Originally 01/24/2020)   Hepatitis C Screening  Completed   HPV VACCINES  Aged Out    Health Maintenance  Health Maintenance Due  Topic Date Due   Medicare Annual Wellness (AWV)  Never done   DTaP/Tdap/Td (1 - Tdap) Never done   COLONOSCOPY (Pts 45-27yrs Insurance coverage will need to be confirmed)  Never done   COVID-19 Vaccine (5 - 2023-24 season) 10/09/2021    Colorectal cancer screening: scheduled for 01/26/2022   Lung Cancer Screening: (Low Dose CT Chest recommended if Age 21-80 years, 30 pack-year currently smoking OR have quit w/in 15years.) does not qualify.   Lung Cancer Screening Referral: no  Additional Screening:  Hepatitis C Screening: does qualify; Completed 10/02/2020  Vision Screening: Recommended annual ophthalmology exams for early detection of glaucoma and other disorders of the eye. Is the patient up to date with their annual eye exam?  No  Who is the provider or what is the name of the office in which the patient attends annual eye exams? [facility] If pt is not established with a provider, would  they like to be referred to a provider to establish care? No .   Dental Screening: Recommended annual dental exams for proper oral hygiene  Community Resource Referral / Chronic Care Management: CRR required this  visit?  No   CCM required this visit?  No      Plan:     I have personally reviewed and noted the following in the patient's chart:   Medical and social history Use of alcohol, tobacco or illicit drugs  Current medications and supplements including opioid prescriptions. Patient is not currently taking opioid prescriptions. Functional ability and status Nutritional status Physical activity Advanced directives List of other physicians Hospitalizations, surgeries, and ER visits in previous 12 months Vitals Screenings to include cognitive, depression, and falls Referrals and appointments  In addition, I have reviewed and discussed with patient certain preventive protocols, quality metrics, and best practice recommendations. A written personalized care plan for preventive services as well as general preventive health recommendations were provided to patient.     Barb Merino, LPN   [id number]   Nurse Notes: none  Due to this being a virtual visit, the after visit summary with patients personalized plan was offered to patient via mail or my-chart. Patient would like to access on my-chart

## 2022-01-07 NOTE — Patient Instructions (Signed)
Gregory Rocha , Thank you for taking time to come for your Medicare Wellness Visit. I appreciate your ongoing commitment to your health goals. Please review the following plan we discussed and let me know if I can assist you in the future.   Screening recommendations/referrals: Colonoscopy: scheduled for 01/26/2022 Recommended yearly ophthalmology/optometry visit for glaucoma screening and checkup Recommended yearly dental visit for hygiene and checkup  Vaccinations: Influenza vaccine: decline Pneumococcal vaccine: decline Tdap vaccine: due Shingles vaccine: decline   Covid-19:  02/14/2020, 07/18/2019, 06/26/2019  Advanced directives: Advance directive discussed with you today.   Conditions/risks identified: none  Next appointment: Follow up in one year for your annual wellness visit.   Preventive Care 67 Years and Older, Male Preventive care refers to lifestyle choices and visits with your health care provider that can promote health and wellness. What does preventive care include? A yearly physical exam. This is also called an annual well check. Dental exams once or twice a year. Routine eye exams. Ask your health care provider how often you should have your eyes checked. Personal lifestyle choices, including: Daily care of your teeth and gums. Regular physical activity. Eating a healthy diet. Avoiding tobacco and drug use. Limiting alcohol use. Practicing safe sex. Taking low doses of aspirin every day. Taking vitamin and mineral supplements as recommended by your health care provider. What happens during an annual well check? The services and screenings done by your health care provider during your annual well check will depend on your age, overall health, lifestyle risk factors, and family history of disease. Counseling  Your health care provider may ask you questions about your: Alcohol use. Tobacco use. Drug use. Emotional well-being. Home and relationship  well-being. Sexual activity. Eating habits. History of falls. Memory and ability to understand (cognition). Work and work Astronomer. Screening  You may have the following tests or measurements: Height, weight, and BMI. Blood pressure. Lipid and cholesterol levels. These may be checked every 5 years, or more frequently if you are over 17 years old. Skin check. Lung cancer screening. You may have this screening every year starting at age 110 if you have a 30-pack-year history of smoking and currently smoke or have quit within the past 15 years. Fecal occult blood test (FOBT) of the stool. You may have this test every year starting at age 64. Flexible sigmoidoscopy or colonoscopy. You may have a sigmoidoscopy every 5 years or a colonoscopy every 10 years starting at age 11. Prostate cancer screening. Recommendations will vary depending on your family history and other risks. Hepatitis C blood test. Hepatitis B blood test. Sexually transmitted disease (STD) testing. Diabetes screening. This is done by checking your blood sugar (glucose) after you have not eaten for a while (fasting). You may have this done every 1-3 years. Abdominal aortic aneurysm (AAA) screening. You may need this if you are a current or former smoker. Osteoporosis. You may be screened starting at age 39 if you are at high risk. Talk with your health care provider about your test results, treatment options, and if necessary, the need for more tests. Vaccines  Your health care provider may recommend certain vaccines, such as: Influenza vaccine. This is recommended every year. Tetanus, diphtheria, and acellular pertussis (Tdap, Td) vaccine. You may need a Td booster every 10 years. Zoster vaccine. You may need this after age 9. Pneumococcal 13-valent conjugate (PCV13) vaccine. One dose is recommended after age 11. Pneumococcal polysaccharide (PPSV23) vaccine. One dose is recommended after age 41. Talk  to your health care  provider about which screenings and vaccines you need and how often you need them. This information is not intended to replace advice given to you by your health care provider. Make sure you discuss any questions you have with your health care provider. Document Released: 02/21/2015 Document Revised: 10/15/2015 Document Reviewed: 11/26/2014 Elsevier Interactive Patient Education  2017 [place] Prevention in the Home Falls can cause injuries. They can happen to people of all ages. There are many things you can do to make your home safe and to help prevent falls. What can I do on the outside of my home? Regularly fix the edges of walkways and driveways and fix any cracks. Remove anything that might make you trip as you walk through a door, such as a raised step or threshold. Trim any bushes or trees on the path to your home. Use bright outdoor lighting. Clear any walking paths of anything that might make someone trip, such as rocks or tools. Regularly check to see if handrails are loose or broken. Make sure that both sides of any steps have handrails. Any raised decks and porches should have guardrails on the edges. Have any leaves, snow, or ice cleared regularly. Use sand or salt on walking paths during winter. Clean up any spills in your garage right away. This includes oil or grease spills. What can I do in the bathroom? Use night lights. Install grab bars by the toilet and in the tub and shower. Do not use towel bars as grab bars. Use non-skid mats or decals in the tub or shower. If you need to sit down in the shower, use a plastic, non-slip stool. Keep the floor dry. Clean up any water that spills on the floor as soon as it happens. Remove soap buildup in the tub or shower regularly. Attach bath mats securely with double-sided non-slip rug tape. Do not have throw rugs and other things on the floor that can make you trip. What can I do in the bedroom? Use night lights. Make  sure that you have a light by your bed that is easy to reach. Do not use any sheets or blankets that are too big for your bed. They should not hang down onto the floor. Have a firm chair that has side arms. You can use this for support while you get dressed. Do not have throw rugs and other things on the floor that can make you trip. What can I do in the kitchen? Clean up any spills right away. Avoid walking on wet floors. Keep items that you use a lot in easy-to-reach places. If you need to reach something above you, use a strong step stool that has a grab bar. Keep electrical cords out of the way. Do not use floor polish or wax that makes floors slippery. If you must use wax, use non-skid floor wax. Do not have throw rugs and other things on the floor that can make you trip. What can I do with my stairs? Do not leave any items on the stairs. Make sure that there are handrails on both sides of the stairs and use them. Fix handrails that are broken or loose. Make sure that handrails are as long as the stairways. Check any carpeting to make sure that it is firmly attached to the stairs. Fix any carpet that is loose or worn. Avoid having throw rugs at the top or bottom of the stairs. If you do have throw rugs,  attach them to the floor with carpet tape. Make sure that you have a light switch at the top of the stairs and the bottom of the stairs. If you do not have them, ask someone to add them for you. What else can I do to help prevent falls? Wear shoes that: Do not have high heels. Have rubber bottoms. Are comfortable and fit you well. Are closed at the toe. Do not wear sandals. If you use a stepladder: Make sure that it is fully opened. Do not climb a closed stepladder. Make sure that both sides of the stepladder are locked into place. Ask someone to hold it for you, if possible. Clearly mark and make sure that you can see: Any grab bars or handrails. First and last steps. Where the  edge of each step is. Use tools that help you move around (mobility aids) if they are needed. These include: Canes. Walkers. Scooters. Crutches. Turn on the lights when you go into a dark area. Replace any light bulbs as soon as they burn out. Set up your furniture so you have a clear path. Avoid moving your furniture around. If any of your floors are uneven, fix them. If there are any pets around you, be aware of where they are. Review your medicines with your doctor. Some medicines can make you feel dizzy. This can increase your chance of falling. Ask your doctor what other things that you can do to help prevent falls. This information is not intended to replace advice given to you by your health care provider. Make sure you discuss any questions you have with your health care provider. Document Released: 11/21/2008 Document Revised: 07/03/2015 Document Reviewed: 03/01/2014 Elsevier Interactive Patient Education  2017 [employer].

## 2022-01-26 DIAGNOSIS — K635 Polyp of colon: Secondary | ICD-10-CM | POA: Diagnosis not present

## 2022-01-26 DIAGNOSIS — Z1211 Encounter for screening for malignant neoplasm of colon: Secondary | ICD-10-CM | POA: Diagnosis not present

## 2022-01-26 DIAGNOSIS — K5730 Diverticulosis of large intestine without perforation or abscess without bleeding: Secondary | ICD-10-CM | POA: Diagnosis not present

## 2022-01-26 LAB — HM COLONOSCOPY

## 2022-06-30 ENCOUNTER — Telehealth: Payer: Self-pay

## 2022-06-30 NOTE — Telephone Encounter (Signed)
PER [facility] HAS NOT RESPONDED TO MULTIPLE ATTEMPTS TO SCHEDULE AN APPT. [phone]

## 2023-10-04 ENCOUNTER — Other Ambulatory Visit: Payer: Self-pay | Admitting: Urology

## 2023-10-04 DIAGNOSIS — R3912 Poor urinary stream: Secondary | ICD-10-CM

## 2023-10-04 DIAGNOSIS — N138 Other obstructive and reflux uropathy: Secondary | ICD-10-CM

## 2023-10-04 DIAGNOSIS — R972 Elevated prostate specific antigen [PSA]: Secondary | ICD-10-CM

## 2023-10-06 ENCOUNTER — Encounter: Payer: Self-pay | Admitting: Urology

## 2023-11-04 ENCOUNTER — Ambulatory Visit
Admission: RE | Admit: 2023-11-04 | Discharge: 2023-11-04 | Disposition: A | Discharge status: Home / Self Care | Source: Ambulatory Visit | Attending: Urology | Admitting: Urology

## 2023-11-04 DIAGNOSIS — R3912 Poor urinary stream: Secondary | ICD-10-CM

## 2023-11-04 DIAGNOSIS — R972 Elevated prostate specific antigen [PSA]: Secondary | ICD-10-CM

## 2023-11-04 DIAGNOSIS — N138 Other obstructive and reflux uropathy: Secondary | ICD-10-CM

## 2023-11-04 MED ORDER — GADOPICLENOL 0.5 MMOL/ML IV SOLN
8.0000 mL | Freq: Once | INTRAVENOUS | Status: AC | PRN
  Administered 2023-11-04: 8 mL via INTRAVENOUS

## 2023-12-21 ENCOUNTER — Telehealth: Payer: Self-pay

## 2023-12-21 NOTE — Telephone Encounter (Signed)
 Tried to make appt with PCP. Patient states that he isnt sure when he can make an appt. He will call us  back.
